# Patient Record
Sex: Female | Born: 1970
Health system: Southern US, Community
[De-identification: ages and names within clinical notes are randomized; demographics above are authoritative.]

## PROBLEM LIST (undated history)

## (undated) DIAGNOSIS — E079 Disorder of thyroid, unspecified: Secondary | ICD-10-CM

## (undated) DIAGNOSIS — M8580 Other specified disorders of bone density and structure, unspecified site: Secondary | ICD-10-CM

## (undated) HISTORY — DX: Other specified disorders of bone density and structure, unspecified site: M85.80

## (undated) HISTORY — PX: NO PAST SURGERIES: SHX2092

## (undated) HISTORY — DX: Disorder of thyroid, unspecified: E07.9

---

## 2010-10-03 ENCOUNTER — Encounter
Admission: RE | Admit: 2010-10-03 | Discharge: 2010-10-03 | Payer: Self-pay | Source: Home / Self Care | Attending: Obstetrics & Gynecology | Admitting: Obstetrics & Gynecology

## 2012-06-18 ENCOUNTER — Other Ambulatory Visit: Payer: Self-pay | Admitting: Obstetrics & Gynecology

## 2012-06-18 DIAGNOSIS — Z1231 Encounter for screening mammogram for malignant neoplasm of breast: Secondary | ICD-10-CM

## 2012-07-16 ENCOUNTER — Ambulatory Visit: Payer: Self-pay

## 2012-07-31 ENCOUNTER — Ambulatory Visit
Admission: RE | Admit: 2012-07-31 | Discharge: 2012-07-31 | Disposition: A | Discharge status: Home / Self Care | Payer: BC Managed Care – PPO | Source: Ambulatory Visit | Attending: Obstetrics & Gynecology | Admitting: Obstetrics & Gynecology

## 2012-07-31 DIAGNOSIS — Z1231 Encounter for screening mammogram for malignant neoplasm of breast: Secondary | ICD-10-CM

## 2012-08-09 ENCOUNTER — Ambulatory Visit: Payer: Self-pay

## 2017-03-20 DIAGNOSIS — E039 Hypothyroidism, unspecified: Secondary | ICD-10-CM | POA: Insufficient documentation

## 2017-03-26 ENCOUNTER — Other Ambulatory Visit: Payer: Self-pay | Admitting: Physician Assistant

## 2017-03-26 DIAGNOSIS — M8580 Other specified disorders of bone density and structure, unspecified site: Secondary | ICD-10-CM

## 2017-03-26 DIAGNOSIS — Z1231 Encounter for screening mammogram for malignant neoplasm of breast: Secondary | ICD-10-CM

## 2017-09-05 ENCOUNTER — Ambulatory Visit
Admission: RE | Admit: 2017-09-05 | Discharge: 2017-09-05 | Disposition: A | Discharge status: Home / Self Care | Payer: 59 | Source: Ambulatory Visit | Attending: Physician Assistant | Admitting: Physician Assistant

## 2017-09-05 DIAGNOSIS — Z1231 Encounter for screening mammogram for malignant neoplasm of breast: Secondary | ICD-10-CM

## 2017-09-05 DIAGNOSIS — M8580 Other specified disorders of bone density and structure, unspecified site: Secondary | ICD-10-CM

## 2017-09-05 DIAGNOSIS — M8589 Other specified disorders of bone density and structure, multiple sites: Secondary | ICD-10-CM | POA: Insufficient documentation

## 2018-11-07 ENCOUNTER — Other Ambulatory Visit: Payer: Self-pay | Admitting: Physician Assistant

## 2018-11-07 DIAGNOSIS — Z1231 Encounter for screening mammogram for malignant neoplasm of breast: Secondary | ICD-10-CM

## 2018-11-15 ENCOUNTER — Ambulatory Visit
Admission: RE | Admit: 2018-11-15 | Discharge: 2018-11-15 | Disposition: A | Discharge status: Home / Self Care | Payer: No Typology Code available for payment source | Source: Ambulatory Visit | Attending: Physician Assistant | Admitting: Physician Assistant

## 2018-11-15 ENCOUNTER — Other Ambulatory Visit: Payer: Self-pay

## 2018-11-15 ENCOUNTER — Other Ambulatory Visit: Payer: Self-pay | Admitting: Obstetrics & Gynecology

## 2018-11-15 DIAGNOSIS — Z1231 Encounter for screening mammogram for malignant neoplasm of breast: Secondary | ICD-10-CM

## 2018-11-22 ENCOUNTER — Other Ambulatory Visit: Payer: Self-pay

## 2018-11-26 ENCOUNTER — Encounter: Payer: Self-pay | Admitting: Obstetrics & Gynecology

## 2018-11-26 ENCOUNTER — Other Ambulatory Visit: Payer: Self-pay

## 2018-11-26 ENCOUNTER — Ambulatory Visit (INDEPENDENT_AMBULATORY_CARE_PROVIDER_SITE_OTHER): Payer: No Typology Code available for payment source | Admitting: Obstetrics & Gynecology

## 2018-11-26 VITALS — BP 102/68 | Ht 66.0 in | Wt 138.0 lb

## 2018-11-26 DIAGNOSIS — Z789 Other specified health status: Secondary | ICD-10-CM

## 2018-11-26 DIAGNOSIS — E039 Hypothyroidism, unspecified: Secondary | ICD-10-CM | POA: Diagnosis not present

## 2018-11-26 DIAGNOSIS — Z01419 Encounter for gynecological examination (general) (routine) without abnormal findings: Secondary | ICD-10-CM | POA: Diagnosis not present

## 2018-11-26 DIAGNOSIS — M8580 Other specified disorders of bone density and structure, unspecified site: Secondary | ICD-10-CM

## 2018-11-26 MED ORDER — LEVOTHYROXINE SODIUM 100 MCG PO TABS: 100.0000 ug | ORAL_TABLET | Freq: Every day | ORAL | 4 refills | Status: DC

## 2018-11-26 NOTE — Progress Notes (Signed)
Laura Murphy 02-May-1971 [id number]   History:    48 y.o. G2P1A1L1 Married  RP:  Established patient presenting for annual gyn exam   HPI:  Menses every 1-2 months, normal flow.  No BTB.  No pelvic pain.  No pain with IC.  Using condoms.  Breasts tender before menses.  BMI 22.27.  Needs to exercise more.  Will do fasting Health labs here today.   Past medical history,surgical history, family history and social history were all reviewed and documented in the EPIC chart.  Gynecologic History Patient's last menstrual period was 11/22/2018. Contraception: condoms Last Pap: 2017. Results were: normal Last mammogram: 11/2018.  Results were: Negative Bone Density: Osteopenia -1.5 09/2017.  Colonoscopy: Never  Obstetric History OB History  Gravida Para Term Preterm AB Living  2       2 1   SAB TAB Ectopic Multiple Live Births  1 1     1     # Outcome Date GA Lbr Len/2nd Weight Sex Delivery Anes PTL Lv  2 TAB           1 SAB              ROS: A ROS was performed and pertinent positives and negatives are included in the history.  GENERAL: No fevers or chills. HEENT: No change in vision, no earache, sore throat or sinus congestion. NECK: No pain or stiffness. CARDIOVASCULAR: No chest pain or pressure. No palpitations. PULMONARY: No shortness of breath, cough or wheeze. GASTROINTESTINAL: No abdominal pain, nausea, vomiting or diarrhea, melena or bright red blood per rectum. GENITOURINARY: No urinary frequency, urgency, hesitancy or dysuria. MUSCULOSKELETAL: No joint or muscle pain, no back pain, no recent trauma. DERMATOLOGIC: No rash, no itching, no lesions. ENDOCRINE: No polyuria, polydipsia, no heat or cold intolerance. No recent change in weight. HEMATOLOGICAL: No anemia or easy bruising or bleeding. NEUROLOGIC: No headache, seizures, numbness, tingling or weakness. PSYCHIATRIC: No depression, no loss of interest in normal activity or change in sleep pattern.     Exam:   BP 102/68    Ht 5' 6"  (1.676 m)   Wt 138 lb (62.6 kg)   LMP 11/22/2018   BMI 22.27 kg/m   Body mass index is 22.27 kg/m.  General appearance : Well developed well nourished female. No acute distress HEENT: Eyes: no retinal hemorrhage or exudates,  Neck supple, trachea midline, no carotid bruits, no thyroidmegaly Lungs: Clear to auscultation, no rhonchi or wheezes, or rib retractions  Heart: Regular rate and rhythm, no murmurs or gallops Breast:Examined in sitting and supine position were symmetrical in appearance, no palpable masses or tenderness,  no skin retraction, no nipple inversion, no nipple discharge, no skin discoloration, no axillary or supraclavicular lymphadenopathy Abdomen: no palpable masses or tenderness, no rebound or guarding Extremities: no edema or skin discoloration or tenderness  Pelvic: Vulva: Normal             Vagina: No gross lesions or discharge  Cervix: No gross lesions or discharge.  Pap reflex done.  Uterus  AV, normal size, shape and consistency, non-tender and mobile  Adnexa  Without masses or tenderness  Anus: Normal   Assessment/Plan:  48 y.o. female for annual exam   1. Encounter for routine gynecological examination with Papanicolaou smear of cervix Normal gynecologic exam.  Pap reflex done.  Breast exam normal.  Screening mammogram March 2020-.  Fasting health labs here today. - CBC - Comp Met (CMET) - TSH - Lipid panel - VITAMIN  D 25 Hydroxy (Vit-D Deficiency, Fractures)  2. Uses condoms for birth control  3. Acquired hypothyroidism TSH checked today.  Levothyroxine 100 mcg 1 tablet daily represcribed.  Will adjust if TSH is abnormal.  4. Osteopenia, multiple sites BD 09/2017 Osteopenia T-Score -1.5.  Recommend vitamin D supplements, calcium intake total of 1.2 to 1.5 g/day and regular weightbearing physical activities.  We will adjust vitamin D supplements according to vitamin D levels today.  We will repeat a bone density here in January 2021. - DG  Bone Density; Future  Other orders - levothyroxine (SYNTHROID, LEVOTHROID) 100 MCG tablet; Take 1 tablet (100 mcg total) by mouth daily before breakfast.  Counseling on above issues and coordination of care more than 50% for 10 minutes.  Princess Bruins MD, 9:27 AM 11/26/2018

## 2018-11-26 NOTE — Patient Instructions (Signed)
1. Encounter for routine gynecological examination with Papanicolaou smear of cervix Normal gynecologic exam.  Pap reflex done.  Breast exam normal.  Screening mammogram March 2020-.  Fasting health labs here today. - CBC - Comp Met (CMET) - TSH - Lipid panel - VITAMIN D 25 Hydroxy (Vit-D Deficiency, Fractures)  2. Uses condoms for birth control  3. Acquired hypothyroidism TSH checked today.  Levothyroxine 100 mcg 1 tablet daily represcribed.  Will adjust if TSH is abnormal.  4. Osteopenia, multiple sites BD 09/2017 Osteopenia T-Score -1.5.  Recommend vitamin D supplements, calcium intake total of 1.2 to 1.5 g/day and regular weightbearing physical activities.  We will adjust vitamin D supplements according to vitamin D levels today.  We will repeat a bone density here in January 2021. - DG Bone Density; Future  Other orders - levothyroxine (SYNTHROID, LEVOTHROID) 100 MCG tablet; Take 1 tablet (100 mcg total) by mouth daily before breakfast.  Laura Murphy, it was a pleasure seeing you today!  I will inform you of your results as soon as they are available.

## 2018-11-27 LAB — CBC
HCT: 37.1 % (ref 35.0–45.0)
Hemoglobin: 12.4 g/dL (ref 11.7–15.5)
MCH: 30 pg (ref 27.0–33.0)
MCHC: 33.4 g/dL (ref 32.0–36.0)
MCV: 89.8 fL (ref 80.0–100.0)
MPV: 11.1 fL (ref 7.5–12.5)
Platelets: 250 10*3/uL (ref 140–400)
RBC: 4.13 10*6/uL (ref 3.80–5.10)
RDW: 12.5 % (ref 11.0–15.0)
WBC: 5.1 10*3/uL (ref 3.8–10.8)

## 2018-11-27 LAB — LIPID PANEL
Cholesterol: 162 mg/dL (ref <200)
HDL: 46 mg/dL — ABNORMAL LOW (ref >=50)
LDL Cholesterol (Calc): 100 mg/dL (calc) — ABNORMAL HIGH
Non-HDL Cholesterol (Calc): 116 mg/dL (calc) (ref <130)
Total CHOL/HDL Ratio: 3.5 (calc) (ref <5.0)
Triglycerides: 69 mg/dL (ref <150)

## 2018-11-27 LAB — VITAMIN D 25 HYDROXY (VIT D DEFICIENCY, FRACTURES): Vit D, 25-Hydroxy: 69 ng/mL (ref 30–100)

## 2018-11-27 LAB — PAP IG W/ RFLX HPV ASCU

## 2018-11-27 LAB — COMPREHENSIVE METABOLIC PANEL
AG Ratio: 1.4 (calc) (ref 1.0–2.5)
ALT: 9 U/L (ref 6–29)
AST: 12 U/L (ref 10–35)
Albumin: 4.3 g/dL (ref 3.6–5.1)
Alkaline phosphatase (APISO): 69 U/L (ref 31–125)
BUN: 14 mg/dL (ref 7–25)
CO2: 28 mmol/L (ref 20–32)
Calcium: 9.2 mg/dL (ref 8.6–10.2)
Chloride: 106 mmol/L (ref 98–110)
Creat: 0.76 mg/dL (ref 0.50–1.10)
Globulin: 3.1 g/dL (calc) (ref 1.9–3.7)
Glucose, Bld: 85 mg/dL (ref 65–99)
Potassium: 4.6 mmol/L (ref 3.5–5.3)
Sodium: 140 mmol/L (ref 135–146)
Total Bilirubin: 0.5 mg/dL (ref 0.2–1.2)
Total Protein: 7.4 g/dL (ref 6.1–8.1)

## 2018-11-27 LAB — TSH: TSH: 2.98 mIU/L

## 2018-11-28 ENCOUNTER — Telehealth: Payer: Self-pay | Admitting: *Deleted

## 2018-11-28 NOTE — Telephone Encounter (Signed)
Patient informed with the below note, she does not eat much fish due to the mercury. She asked if a fish oil supplement would help improve HDL? If so do you recommend Omega fish 3,6.9. or 12 ?

## 2018-11-28 NOTE — Telephone Encounter (Signed)
-----   Message from Genia Del, MD sent at 11/27/2018 11:10 AM EDT ----- Pap test negative.  Labs all normal.  Minor diet adjustments, more fish to improve HDL.  LDL borderline at 100.  Ratio of Total Chol/HDL ok at 3.5.

## 2018-11-29 NOTE — Telephone Encounter (Signed)
Yes, Omega fish 9 would be good.

## 2018-11-29 NOTE — Telephone Encounter (Signed)
Patient informed. 

## 2018-12-12 ENCOUNTER — Ambulatory Visit: Payer: Self-pay | Admitting: Obstetrics & Gynecology

## 2018-12-17 ENCOUNTER — Telehealth: Payer: Self-pay | Admitting: *Deleted

## 2018-12-17 MED ORDER — SYNTHROID 100 MCG PO TABS: 100.0000 ug | ORAL_TABLET | Freq: Every day | ORAL | 4 refills | Status: DC

## 2018-12-17 NOTE — Telephone Encounter (Signed)
Patient called requesting if 90 day supply of brand synthroid 100 mcg could be sent to [facility] in [place]. Rx sent.

## 2019-01-13 ENCOUNTER — Ambulatory Visit: Payer: Self-pay | Admitting: Obstetrics & Gynecology

## 2019-09-03 ENCOUNTER — Telehealth: Payer: Self-pay

## 2019-09-03 MED ORDER — LEVOTHYROXINE SODIUM 100 MCG PO TABS: 100.0000 ug | ORAL_TABLET | Freq: Every day | ORAL | 0 refills | Status: DC

## 2019-09-03 NOTE — Telephone Encounter (Addendum)
Patient is 6 hours away on vacation and realized she left her Synthroid at home. Is requesting #4 be sent into pharmacy at vacation site.  Rx sent.  Patient informed.  I called pharmacy and left message that Rx should be DAW/brand only as I forgot to indicate that on electronic RX.

## 2019-12-01 ENCOUNTER — Other Ambulatory Visit: Payer: Self-pay

## 2019-12-02 ENCOUNTER — Encounter: Payer: Self-pay | Admitting: Obstetrics & Gynecology

## 2019-12-02 ENCOUNTER — Ambulatory Visit (INDEPENDENT_AMBULATORY_CARE_PROVIDER_SITE_OTHER): Payer: BC Managed Care – PPO | Admitting: Obstetrics & Gynecology

## 2019-12-02 VITALS — BP 102/68 | Ht 66.25 in | Wt 138.0 lb

## 2019-12-02 DIAGNOSIS — Z789 Other specified health status: Secondary | ICD-10-CM | POA: Diagnosis not present

## 2019-12-02 DIAGNOSIS — E786 Lipoprotein deficiency: Secondary | ICD-10-CM | POA: Diagnosis not present

## 2019-12-02 DIAGNOSIS — M8580 Other specified disorders of bone density and structure, unspecified site: Secondary | ICD-10-CM | POA: Diagnosis not present

## 2019-12-02 DIAGNOSIS — Z1321 Encounter for screening for nutritional disorder: Secondary | ICD-10-CM | POA: Diagnosis not present

## 2019-12-02 DIAGNOSIS — N951 Menopausal and female climacteric states: Secondary | ICD-10-CM | POA: Diagnosis not present

## 2019-12-02 DIAGNOSIS — Z01419 Encounter for gynecological examination (general) (routine) without abnormal findings: Secondary | ICD-10-CM | POA: Diagnosis not present

## 2019-12-02 DIAGNOSIS — E039 Hypothyroidism, unspecified: Secondary | ICD-10-CM

## 2019-12-02 LAB — COMPREHENSIVE METABOLIC PANEL
AG Ratio: 1.4 (calc) (ref 1.0–2.5)
ALT: 10 U/L (ref 6–29)
AST: 13 U/L (ref 10–35)
Albumin: 4 g/dL (ref 3.6–5.1)
Alkaline phosphatase (APISO): 63 U/L (ref 31–125)
BUN: 10 mg/dL (ref 7–25)
CO2: 28 mmol/L (ref 20–32)
Calcium: 9 mg/dL (ref 8.6–10.2)
Chloride: 106 mmol/L (ref 98–110)
Creat: 0.62 mg/dL (ref 0.50–1.10)
Globulin: 2.9 g/dL (calc) (ref 1.9–3.7)
Glucose, Bld: 84 mg/dL (ref 65–99)
Potassium: 4 mmol/L (ref 3.5–5.3)
Sodium: 138 mmol/L (ref 135–146)
Total Bilirubin: 0.4 mg/dL (ref 0.2–1.2)
Total Protein: 6.9 g/dL (ref 6.1–8.1)

## 2019-12-02 LAB — CBC
HCT: 37.8 % (ref 35.0–45.0)
Hemoglobin: 12.6 g/dL (ref 11.7–15.5)
MCH: 30.1 pg (ref 27.0–33.0)
MCHC: 33.3 g/dL (ref 32.0–36.0)
MCV: 90.2 fL (ref 80.0–100.0)
MPV: 10.3 fL (ref 7.5–12.5)
Platelets: 271 10*3/uL (ref 140–400)
RBC: 4.19 10*6/uL (ref 3.80–5.10)
RDW: 12.1 % (ref 11.0–15.0)
WBC: 4.3 10*3/uL (ref 3.8–10.8)

## 2019-12-02 LAB — TSH: TSH: 1.33 mIU/L

## 2019-12-02 LAB — LIPID PANEL
Cholesterol: 169 mg/dL (ref <200)
HDL: 50 mg/dL (ref >=50)
LDL Cholesterol (Calc): 105 mg/dL (calc) — ABNORMAL HIGH
Non-HDL Cholesterol (Calc): 119 mg/dL (calc) (ref <130)
Total CHOL/HDL Ratio: 3.4 (calc) (ref <5.0)
Triglycerides: 49 mg/dL (ref <150)

## 2019-12-02 LAB — VITAMIN D 25 HYDROXY (VIT D DEFICIENCY, FRACTURES): Vit D, 25-Hydroxy: 43 ng/mL (ref 30–100)

## 2019-12-02 MED ORDER — LEVOTHYROXINE SODIUM 100 MCG PO TABS: 100.0000 ug | ORAL_TABLET | Freq: Every day | ORAL | 4 refills | Status: DC

## 2019-12-02 NOTE — Progress Notes (Signed)
[facility] December 03, 1970 [id number]   History:    49 y.o.  G2P1A1L1 Married  RP:  Established patient presenting for annual gyn exam   HPI:  Menses every 1-2 months, normal flow.  No BTB.  No pelvic pain.  No pain with IC.  Using condoms.  Breasts tender before menses.  BMI 22.11.  Needs to exercise more.  Will do fasting Health labs here today.   Past medical history,surgical history, family history and social history were all reviewed and documented in the EPIC chart.  Gynecologic History No LMP recorded. Patient is premenopausal.  Obstetric History OB History  Gravida Para Term Preterm AB Living  2       2 1   SAB TAB Ectopic Multiple Live Births  1 1     1     # Outcome Date GA Lbr Len/2nd Weight Sex Delivery Anes PTL Lv  2 TAB           1 SAB              ROS: A ROS was performed and pertinent positives and negatives are included in the history.  GENERAL: No fevers or chills. HEENT: No change in vision, no earache, sore throat or sinus congestion. NECK: No pain or stiffness. CARDIOVASCULAR: No chest pain or pressure. No palpitations. PULMONARY: No shortness of breath, cough or wheeze. GASTROINTESTINAL: No abdominal pain, nausea, vomiting or diarrhea, melena or bright red blood per rectum. GENITOURINARY: No urinary frequency, urgency, hesitancy or dysuria. MUSCULOSKELETAL: No joint or muscle pain, no back pain, no recent trauma. DERMATOLOGIC: No rash, no itching, no lesions. ENDOCRINE: No polyuria, polydipsia, no heat or cold intolerance. No recent change in weight. HEMATOLOGICAL: No anemia or easy bruising or bleeding. NEUROLOGIC: No headache, seizures, numbness, tingling or weakness. PSYCHIATRIC: No depression, no loss of interest in normal activity or change in sleep pattern.     Exam:   BP 102/68    Ht 5' 6.25" (1.683 m)    Wt 138 lb (62.6 kg)    BMI 22.11 kg/m   Body mass index is 22.11 kg/m.  General appearance : Well developed well nourished female. No acute  distress HEENT: Eyes: no retinal hemorrhage or exudates,  Neck supple, trachea midline, no carotid bruits, no thyroidmegaly Lungs: Clear to auscultation, no rhonchi or wheezes, or rib retractions  Heart: Regular rate and rhythm, no murmurs or gallops Breast:Examined in sitting and supine position were symmetrical in appearance, no palpable masses or tenderness,  no skin retraction, no nipple inversion, no nipple discharge, no skin discoloration, no axillary or supraclavicular lymphadenopathy Abdomen: no palpable masses or tenderness, no rebound or guarding Extremities: no edema or skin discoloration or tenderness  Pelvic: Vulva: Normal             Vagina: No gross lesions or discharge  Cervix: No gross lesions or discharge.  Pap reflex done.  Uterus  AV, normal size, shape and consistency, non-tender and mobile  Adnexa  Without masses or tenderness  Anus: Normal   Assessment/Plan:  48 y.o. female for annual exam   1. Encounter for routine gynecological examination with Papanicolaou smear of cervix Normal gynecologic exam.  Pap reflex done today.  Breast exam normal.  Will schedule a screening mammogram now.  Colonoscopy to organize after April this year at age 70.  Fasting health labs here today.  Good body mass index at 22.11.  Continue on healthy nutrition and fitness. - CBC - Comp Met (CMET) - TSH -  Lipid panel - VITAMIN D 25 Hydroxy (Vit-D Deficiency, Fractures)  2. Perimenopause Observation in perimenopause.  Recommend vitamin D supplements, calcium intake of 1200 mg daily and regular weightbearing physical activities.  3. Uses condoms for birth control  4. Osteopenia, unspecified location  5. Acquired hypothyroidism TSH done today.  Levothyroxine represcribed at the same dose.  Other orders - levothyroxine (SYNTHROID) 100 MCG tablet; Take 1 tablet (100 mcg total) by mouth daily before breakfast.  Princess Bruins MD, 8:16 AM 12/02/2019

## 2019-12-03 ENCOUNTER — Encounter: Payer: Self-pay | Admitting: Obstetrics & Gynecology

## 2019-12-03 LAB — PAP IG W/ RFLX HPV ASCU

## 2019-12-03 NOTE — Patient Instructions (Signed)
1. Encounter for routine gynecological examination with Papanicolaou smear of cervix Normal gynecologic exam.  Pap reflex done today.  Breast exam normal.  Will schedule a screening mammogram now.  Colonoscopy to organize after April this year at age 49.  Fasting health labs here today.  Good body mass index at 22.11.  Continue on healthy nutrition and fitness. - CBC - Comp Met (CMET) - TSH - Lipid panel - VITAMIN D 25 Hydroxy (Vit-D Deficiency, Fractures)  2. Perimenopause Observation in perimenopause.  Recommend vitamin D supplements, calcium intake of 1200 mg daily and regular weightbearing physical activities.  3. Uses condoms for birth control  4. Osteopenia, unspecified location  5. Acquired hypothyroidism TSH done today.  Levothyroxine represcribed at the same dose.  Other orders - levothyroxine (SYNTHROID) 100 MCG tablet; Take 1 tablet (100 mcg total) by mouth daily before breakfast.  Nevin Bloodgood, it was a pleasure seeing you today! I will inform you of your results as soon as they are available.

## 2019-12-08 ENCOUNTER — Telehealth: Payer: Self-pay | Admitting: *Deleted

## 2019-12-08 ENCOUNTER — Encounter: Payer: Self-pay | Admitting: Gastroenterology

## 2019-12-08 NOTE — Telephone Encounter (Signed)
I called and left detailed message on cell to call [facility] to schedule, no referral needed. Number left on voicemail to call [phone]

## 2019-12-08 NOTE — Telephone Encounter (Signed)
-----   Message from Genia Del, MD sent at 12/02/2019  9:06 AM EDT ----- Regarding: Refer to Gastro Screening Colonoscopy.

## 2019-12-30 ENCOUNTER — Ambulatory Visit (AMBULATORY_SURGERY_CENTER): Payer: Self-pay | Admitting: *Deleted

## 2019-12-30 ENCOUNTER — Other Ambulatory Visit: Payer: Self-pay

## 2019-12-30 VITALS — Temp 96.8°F | Ht 66.25 in | Wt 138.0 lb

## 2019-12-30 DIAGNOSIS — Z1211 Encounter for screening for malignant neoplasm of colon: Secondary | ICD-10-CM

## 2019-12-30 DIAGNOSIS — Z01818 Encounter for other preprocedural examination: Secondary | ICD-10-CM

## 2019-12-30 NOTE — Progress Notes (Signed)
No egg or soy allergy known to patient  No past sedation with any surgeries  or procedures, no past  intubation   No diet pills per patient No home 02 use per patient  No blood thinners per patient  Pt denies issues with constipation  No A fib or A flutter  EMMI video sent to pt's e mail   Due to the COVID-19 pandemic we are asking patients to follow these guidelines. Please only bring one care partner. Please be aware that your care partner may wait in the car in the parking lot or if they feel like they will be too hot to wait in the car, they may wait in the lobby on the 4th floor. All care partners are required to wear a mask the entire time (we do not have any that we can provide them), they need to practice social distancing, and we will do a Covid check for all patient's and care partners when you arrive. Also we will check their temperature and your temperature. If the care partner waits in their car they need to stay in the parking lot the entire time and we will call them on their cell phone when the patient is ready for discharge so they can bring the car to the front of the building. Also all patient's will need to wear a mask into building.  

## 2020-01-01 ENCOUNTER — Other Ambulatory Visit: Payer: Self-pay | Admitting: Obstetrics & Gynecology

## 2020-01-01 NOTE — Telephone Encounter (Signed)
Normal TSH 1.33 on 11/23/19.

## 2020-01-09 ENCOUNTER — Encounter: Payer: Self-pay | Admitting: Gastroenterology

## 2020-01-12 ENCOUNTER — Telehealth: Payer: Self-pay

## 2020-01-12 NOTE — Telephone Encounter (Signed)
Pts colon rescheduled to 02/23/20@11 :30am, covid screen scheduled for 02/19/20@8 :10am. Pt aware of appts.

## 2020-01-12 NOTE — Telephone Encounter (Signed)
Pt will need to be rescheduled to at least 14 days after her second covid vaccine or have a covid test prior to procedure. Please have pt stop prep and reschedule her appt. Dr. Orvan Falconer is off and not her to ok procedure. Dr. Rhea Belton medical director had pt reschedule due to same situation as this is our policy.

## 2020-01-12 NOTE — Telephone Encounter (Signed)
Called pt regarding no show for COVID test that is required for upcoming colonoscopy scheduled for tomorrow.  Pt stated that she was not aware that she had an appointment for a COVID test on 5/7 and said that she didn't notice the appt time in her letter.  Pt said that she had the first dose of the COVID vaccine but has not had the 2nd.  Pt said that she has already started the prep today @ 3:00 pm.  Advised pt to continue with prep as instructed and to arrive as scheduled.  Informed pt that I will need to send a message to Dr. Orvan Falconer to clear her for the procedure since she missed her COVID test and has not had the 2nd dose of the COVID vaccine.

## 2020-01-13 ENCOUNTER — Encounter: Payer: No Typology Code available for payment source | Admitting: Gastroenterology

## 2020-02-23 ENCOUNTER — Encounter: Payer: BC Managed Care – PPO | Admitting: Gastroenterology

## 2020-07-13 ENCOUNTER — Telehealth: Payer: Self-pay

## 2020-07-13 NOTE — Telephone Encounter (Signed)
Ms. Laura Murphy has never been seen here and wanted to know what should she do that she is is have some tightness on her chest and some pressure on her Left arm. Per [place] Meo due that who have no history about her, Laura Murphy should call her PCP to see what she should do next. Pt understood.

## 2020-11-04 DIAGNOSIS — R140 Abdominal distension (gaseous): Secondary | ICD-10-CM | POA: Diagnosis not present

## 2020-11-04 DIAGNOSIS — Z1211 Encounter for screening for malignant neoplasm of colon: Secondary | ICD-10-CM | POA: Diagnosis not present

## 2020-11-04 DIAGNOSIS — K5900 Constipation, unspecified: Secondary | ICD-10-CM | POA: Diagnosis not present

## 2020-11-09 DIAGNOSIS — Z1231 Encounter for screening mammogram for malignant neoplasm of breast: Secondary | ICD-10-CM | POA: Diagnosis not present

## 2020-11-09 DIAGNOSIS — M8589 Other specified disorders of bone density and structure, multiple sites: Secondary | ICD-10-CM | POA: Diagnosis not present

## 2020-12-02 ENCOUNTER — Encounter: Payer: BC Managed Care – PPO | Admitting: Obstetrics & Gynecology

## 2021-01-03 DIAGNOSIS — D122 Benign neoplasm of ascending colon: Secondary | ICD-10-CM | POA: Diagnosis not present

## 2021-01-03 DIAGNOSIS — K635 Polyp of colon: Secondary | ICD-10-CM | POA: Diagnosis not present

## 2021-01-03 DIAGNOSIS — Z1211 Encounter for screening for malignant neoplasm of colon: Secondary | ICD-10-CM | POA: Diagnosis not present

## 2021-01-11 ENCOUNTER — Other Ambulatory Visit: Payer: Self-pay | Admitting: Obstetrics & Gynecology

## 2021-06-14 ENCOUNTER — Telehealth: Payer: Self-pay

## 2021-06-14 MED ORDER — SYNTHROID 100 MCG PO TABS: 100.0000 ug | ORAL_TABLET | Freq: Every day | ORAL | 0 refills | Status: DC

## 2021-06-14 NOTE — Telephone Encounter (Signed)
Refill sent for 30 days supply. Patient informed.

## 2021-06-14 NOTE — Telephone Encounter (Signed)
Laura Del, MD  You 17 minutes ago (2:02 PM)   Agree with refill of Synthroid until Annual gyn exam

## 2021-06-14 NOTE — Telephone Encounter (Signed)
Patient called stating she is out of her Synthroid and needs refill until her AEX scheduled 06/22/21.  Last AEX was 12/02/19.

## 2021-06-22 ENCOUNTER — Other Ambulatory Visit: Payer: Self-pay

## 2021-06-22 ENCOUNTER — Encounter: Payer: Self-pay | Admitting: Obstetrics & Gynecology

## 2021-06-22 ENCOUNTER — Ambulatory Visit (INDEPENDENT_AMBULATORY_CARE_PROVIDER_SITE_OTHER): Payer: BC Managed Care – PPO | Admitting: Obstetrics & Gynecology

## 2021-06-22 VITALS — BP 110/72 | HR 58 | Resp 16 | Ht 66.25 in | Wt 139.0 lb

## 2021-06-22 DIAGNOSIS — Z01419 Encounter for gynecological examination (general) (routine) without abnormal findings: Secondary | ICD-10-CM

## 2021-06-22 DIAGNOSIS — Z789 Other specified health status: Secondary | ICD-10-CM

## 2021-06-22 DIAGNOSIS — E039 Hypothyroidism, unspecified: Secondary | ICD-10-CM | POA: Diagnosis not present

## 2021-06-22 DIAGNOSIS — M8580 Other specified disorders of bone density and structure, unspecified site: Secondary | ICD-10-CM | POA: Diagnosis not present

## 2021-06-22 DIAGNOSIS — N951 Menopausal and female climacteric states: Secondary | ICD-10-CM

## 2021-06-22 MED ORDER — SYNTHROID 100 MCG PO TABS: 100.0000 ug | ORAL_TABLET | Freq: Every day | ORAL | 4 refills | Status: DC

## 2021-06-22 NOTE — Progress Notes (Signed)
Laura Murphy Jul 26, 1971 [id number]   History:    50 y.o. G2P1A1L1 Married.  Son is 63 yo Sophomore in [place].   RP:  Established patient presenting for annual gyn exam    HPI:  Menses every 1-2 months, normal flow.  No BTB.  No pelvic pain.  No pain with IC.  Using condoms.  Breasts normal. Will schedule screening mammo. BMI 22.27.  Needs to exercise more.  Will do fasting Health labs here today.  Past medical history,surgical history, family history and social history were all reviewed and documented in the EPIC chart.  Gynecologic History Patient's last menstrual period was 06/01/2021 (exact date).  Obstetric History OB History  Gravida Para Term Preterm AB Living  2       2 1   SAB IAB Ectopic Multiple Live Births  1 1     1     # Outcome Date GA Lbr Len/2nd Weight Sex Delivery Anes PTL Lv  2 IAB           1 SAB              ROS: A ROS was performed and pertinent positives and negatives are included in the history.  GENERAL: No fevers or chills. HEENT: No change in vision, no earache, sore throat or sinus congestion. NECK: No pain or stiffness. CARDIOVASCULAR: No chest pain or pressure. No palpitations. PULMONARY: No shortness of breath, cough or wheeze. GASTROINTESTINAL: No abdominal pain, nausea, vomiting or diarrhea, melena or bright red blood per rectum. GENITOURINARY: No urinary frequency, urgency, hesitancy or dysuria. MUSCULOSKELETAL: No joint or muscle pain, no back pain, no recent trauma. DERMATOLOGIC: No rash, no itching, no lesions. ENDOCRINE: No polyuria, polydipsia, no heat or cold intolerance. No recent change in weight. HEMATOLOGICAL: No anemia or easy bruising or bleeding. NEUROLOGIC: No headache, seizures, numbness, tingling or weakness. PSYCHIATRIC: No depression, no loss of interest in normal activity or change in sleep pattern.     Exam:   BP 110/72   Pulse (!) 58   Resp 16   Ht 5' 6.25" (1.683 m)   Wt 139 lb (63 kg)   LMP 06/01/2021 (Exact Date)   BMI  22.27 kg/m   Body mass index is 22.27 kg/m.  General appearance : Well developed well nourished female. No acute distress HEENT: Eyes: no retinal hemorrhage or exudates,  Neck supple, trachea midline, no carotid bruits, no thyroidmegaly Lungs: Clear to auscultation, no rhonchi or wheezes, or rib retractions  Heart: Regular rate and rhythm, no murmurs or gallops Breast:Examined in sitting and supine position were symmetrical in appearance, no palpable masses or tenderness,  no skin retraction, no nipple inversion, no nipple discharge, no skin discoloration, no axillary or supraclavicular lymphadenopathy Abdomen: no palpable masses or tenderness, no rebound or guarding Extremities: no edema or skin discoloration or tenderness  Pelvic: Vulva: Normal             Vagina: No gross lesions or discharge  Cervix: No gross lesions or discharge  Uterus  AV, normal size, shape and consistency, non-tender and mobile  Adnexa  Without masses or tenderness  Anus: Normal   Assessment/Plan:  50 y.o. female for annual exam   1. Well female exam with routine gynecological exam Normal gynecologic exam.  Last Pap test in March 2021 was negative, no indication to repeat a Pap test at this time.  Breast exam normal.  We will schedule a screening mammogram now.  Colonoscopy 2021.  Good body mass index  of 22.27.  Continue with fitness and healthy nutrition.  Fasting health labs here today. - CBC - Comp Met (CMET) - Lipid Profile - Vitamin D 1,25 dihydroxy  2. Uses condoms for birth control  3. Perimenopause Oligomenorrhea with menses every 1 to 2 months.  We will check vitamin D level. - Vitamin D 1,25 dihydroxy  4. Osteopenia, unspecified location Osteopenia on bone density in 2020.  We will repeat bone density here now.  Vitamin D supplements, calcium intake of 1.5 g/day and regular weightbearing physical activity is recommended. - DG Bone Density; Future  5. Hypothyroidism  (acquired) Hypothyroidism on Synthroid.  Verify TSH level today.  Synthroid represcribed. - TSH  Other orders - VITAMIN D PO; Take 2,000 Int'l Units by mouth. - SYNTHROID 100 MCG tablet; Take 1 tablet (100 mcg total) by mouth daily before breakfast.   Princess Bruins MD, 8:33 AM 06/22/2021

## 2021-06-23 ENCOUNTER — Encounter: Payer: Self-pay | Admitting: Obstetrics & Gynecology

## 2021-06-25 LAB — CBC
HCT: 40.3 % (ref 35.0–45.0)
Hemoglobin: 13.2 g/dL (ref 11.7–15.5)
MCH: 29 pg (ref 27.0–33.0)
MCHC: 32.8 g/dL (ref 32.0–36.0)
MCV: 88.6 fL (ref 80.0–100.0)
MPV: 10.2 fL (ref 7.5–12.5)
Platelets: 278 10*3/uL (ref 140–400)
RBC: 4.55 10*6/uL (ref 3.80–5.10)
RDW: 12.4 % (ref 11.0–15.0)
WBC: 3.9 10*3/uL (ref 3.8–10.8)

## 2021-06-25 LAB — TSH: TSH: 0.88 mIU/L

## 2021-06-25 LAB — VITAMIN D 1,25 DIHYDROXY
Vitamin D 1, 25 (OH)2 Total: 51 pg/mL (ref 18–72)
Vitamin D2 1, 25 (OH)2: <8 pg/mL
Vitamin D3 1, 25 (OH)2: 51 pg/mL

## 2021-06-25 LAB — COMPREHENSIVE METABOLIC PANEL
AG Ratio: 1.4 (calc) (ref 1.0–2.5)
ALT: 13 U/L (ref 6–29)
AST: 16 U/L (ref 10–35)
Albumin: 4.2 g/dL (ref 3.6–5.1)
Alkaline phosphatase (APISO): 90 U/L (ref 37–153)
BUN: 14 mg/dL (ref 7–25)
CO2: 27 mmol/L (ref 20–32)
Calcium: 8.9 mg/dL (ref 8.6–10.4)
Chloride: 106 mmol/L (ref 98–110)
Creat: 0.65 mg/dL (ref 0.50–1.03)
Globulin: 3 g/dL (calc) (ref 1.9–3.7)
Glucose, Bld: 79 mg/dL (ref 65–99)
Potassium: 3.8 mmol/L (ref 3.5–5.3)
Sodium: 139 mmol/L (ref 135–146)
Total Bilirubin: 0.3 mg/dL (ref 0.2–1.2)
Total Protein: 7.2 g/dL (ref 6.1–8.1)

## 2021-06-25 LAB — LIPID PANEL
Cholesterol: 210 mg/dL — ABNORMAL HIGH (ref <200)
HDL: 55 mg/dL (ref >=50)
LDL Cholesterol (Calc): 139 mg/dL (calc) — ABNORMAL HIGH
Non-HDL Cholesterol (Calc): 155 mg/dL (calc) — ABNORMAL HIGH (ref <130)
Total CHOL/HDL Ratio: 3.8 (calc) (ref <5.0)
Triglycerides: 64 mg/dL (ref <150)

## 2021-10-19 ENCOUNTER — Other Ambulatory Visit: Payer: Self-pay | Admitting: Obstetrics & Gynecology

## 2021-10-19 DIAGNOSIS — Z1231 Encounter for screening mammogram for malignant neoplasm of breast: Secondary | ICD-10-CM

## 2021-11-01 ENCOUNTER — Ambulatory Visit
Admission: RE | Admit: 2021-11-01 | Discharge: 2021-11-01 | Disposition: A | Discharge status: Home / Self Care | Payer: BC Managed Care – PPO | Source: Ambulatory Visit | Attending: Obstetrics & Gynecology | Admitting: Obstetrics & Gynecology

## 2021-11-01 DIAGNOSIS — Z1231 Encounter for screening mammogram for malignant neoplasm of breast: Secondary | ICD-10-CM

## 2022-01-17 ENCOUNTER — Telehealth: Payer: Self-pay | Admitting: *Deleted

## 2022-01-17 MED ORDER — SYNTHROID 100 MCG PO TABS: 100.0000 ug | ORAL_TABLET | Freq: Every day | ORAL | 0 refills | Status: DC

## 2022-01-17 NOTE — Telephone Encounter (Signed)
Patient called left message in triage voicemail requesting refill on synthroid 100 mg tablet. Reports she is out. Annual exam was 06/2021, 1 year refills sent to [facility] in [place]. Patient said in voicemail rx should go to [facility]. Rx sent. I was going to call patient to relay however her voicemail is full. Rx sent to local pharmacy per her request. ?

## 2022-03-10 DIAGNOSIS — M546 Pain in thoracic spine: Secondary | ICD-10-CM | POA: Diagnosis not present

## 2022-03-10 DIAGNOSIS — S161XXA Strain of muscle, fascia and tendon at neck level, initial encounter: Secondary | ICD-10-CM | POA: Diagnosis not present

## 2022-03-16 ENCOUNTER — Ambulatory Visit
Admission: RE | Admit: 2022-03-16 | Discharge: 2022-03-16 | Disposition: A | Discharge status: Home / Self Care | Payer: BC Managed Care – PPO | Source: Ambulatory Visit | Attending: Obstetrics & Gynecology | Admitting: Obstetrics & Gynecology

## 2022-03-16 DIAGNOSIS — M8589 Other specified disorders of bone density and structure, multiple sites: Secondary | ICD-10-CM | POA: Diagnosis not present

## 2022-03-16 DIAGNOSIS — M8580 Other specified disorders of bone density and structure, unspecified site: Secondary | ICD-10-CM

## 2022-03-16 DIAGNOSIS — Z780 Asymptomatic menopausal state: Secondary | ICD-10-CM | POA: Diagnosis not present

## 2022-04-23 IMAGING — MG MM DIGITAL SCREENING BILAT W/ TOMO AND CAD
8 series · 8 of 24 positions shown · non-contrast
Comparison: Previous exam(s).

CLINICAL DATA: Screening.

EXAM:
DIGITAL SCREENING BILATERAL MAMMOGRAM WITH TOMOSYNTHESIS AND CAD
TECHNIQUE: Bilateral screening digital craniocaudal and mediolateral oblique
mammograms were obtained. Bilateral screening digital breast
tomosynthesis was performed. The images were evaluated with
computer-aided detection.

[L MLO synth-2D]
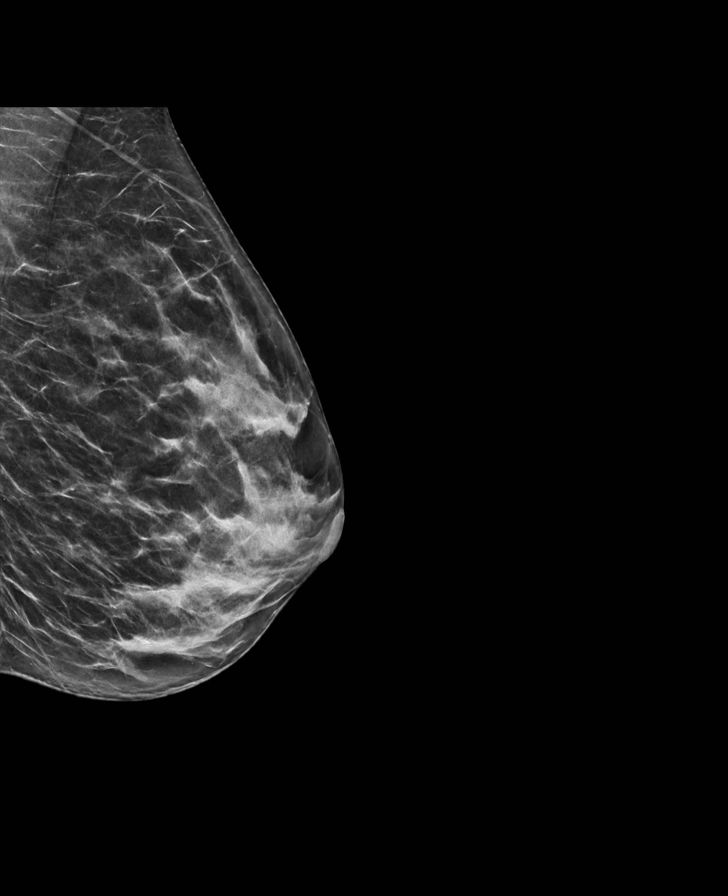

[L CC synth-2D]
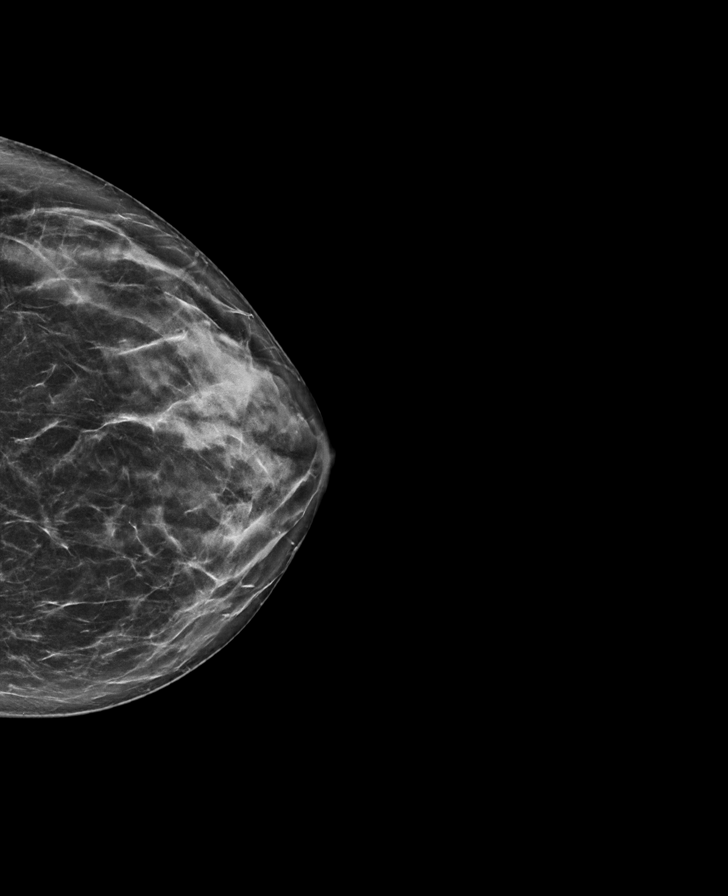

[R MLO synth-2D]
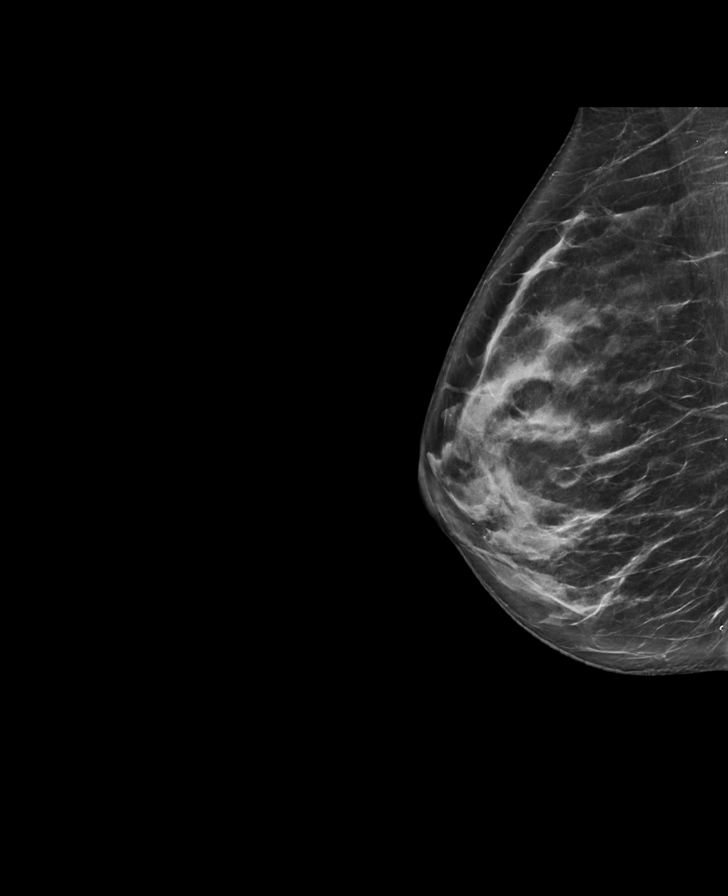

[R CC synth-2D]
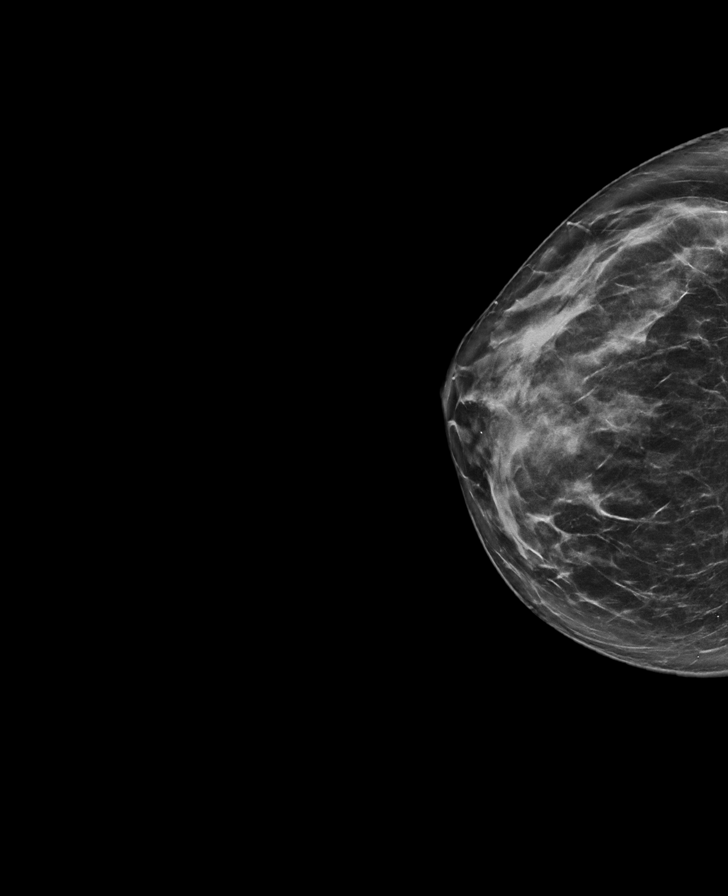

[R CC tomo · tomo slice 35/69.0]
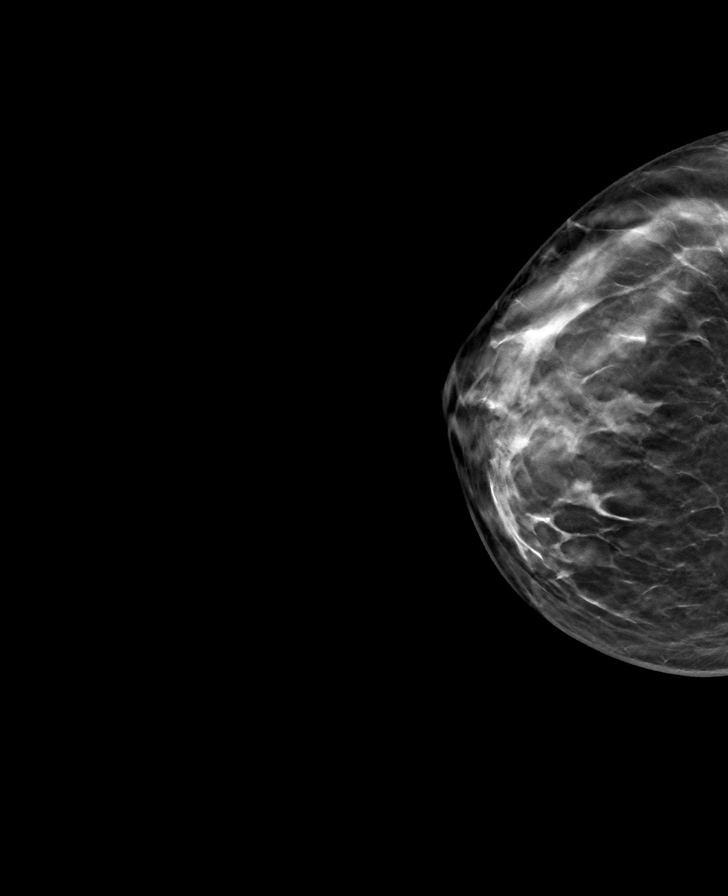

[L CC tomo · tomo slice 34/67.0]
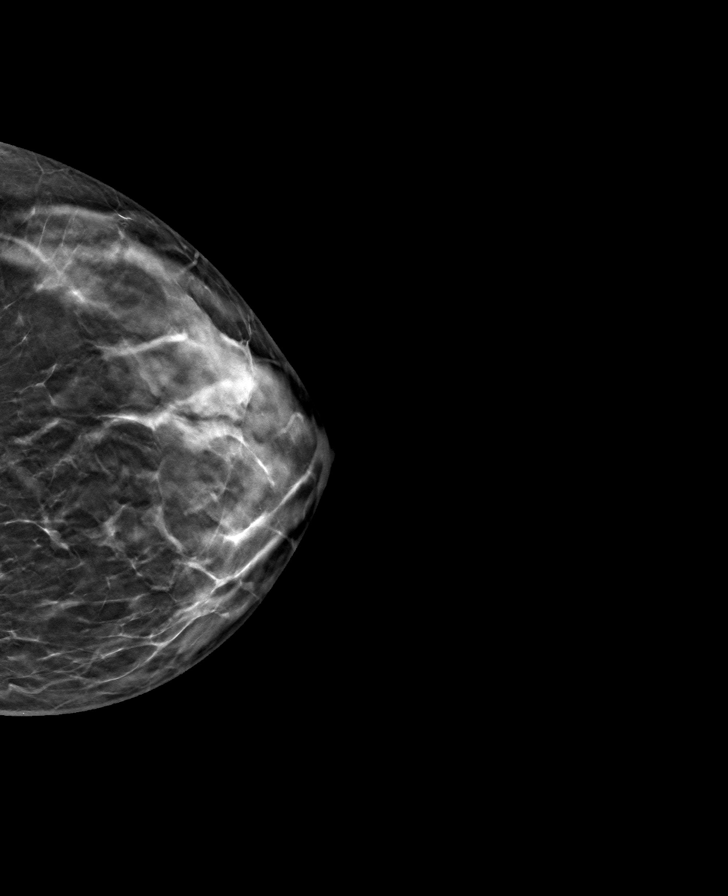

[L MLO tomo · tomo slice 32/63.0]
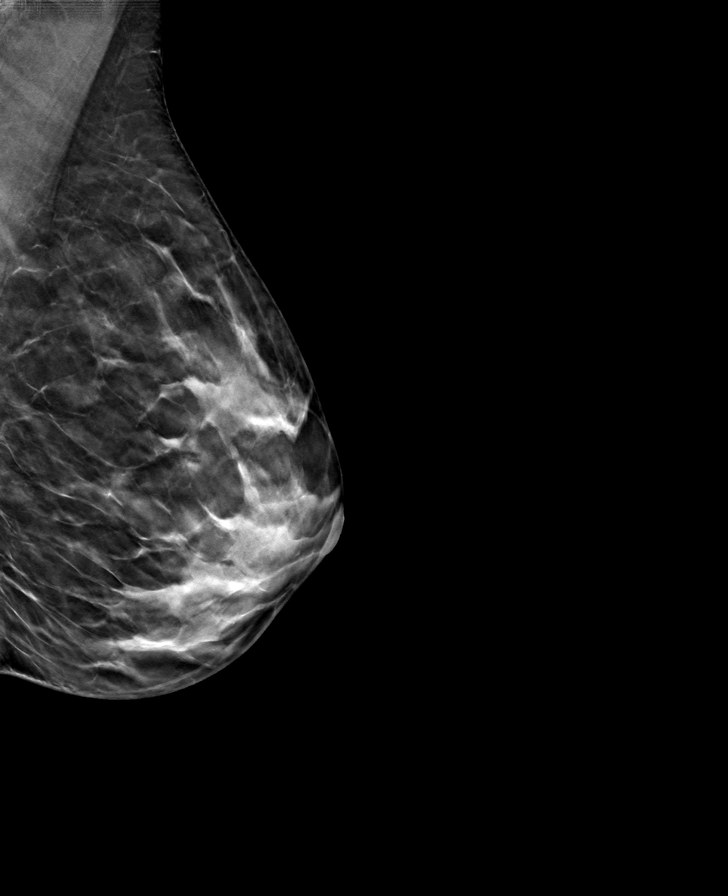

[R MLO tomo · tomo slice 39/76.0]
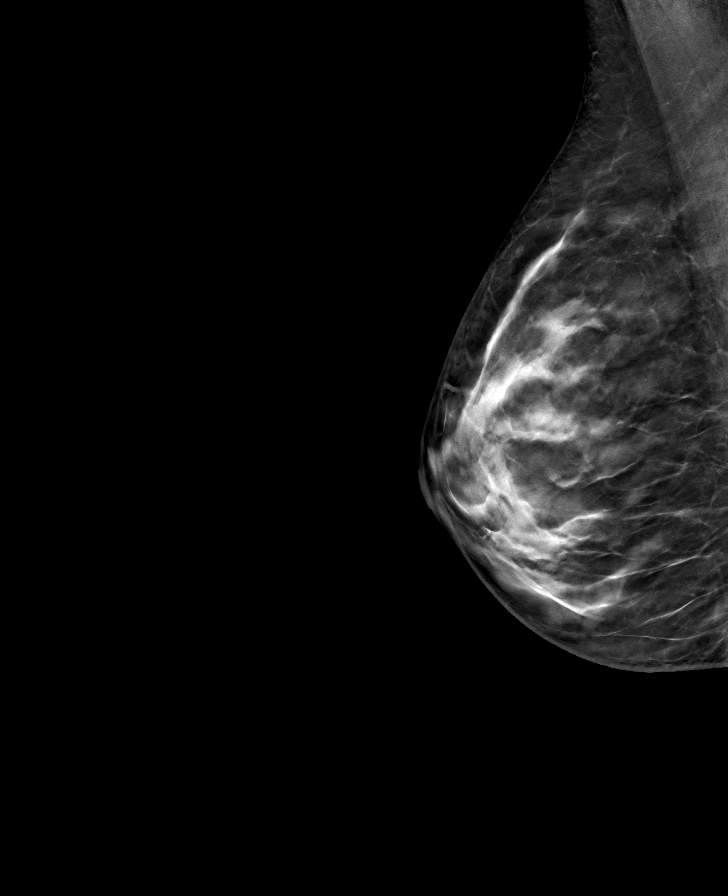

[8 of 24 positions shown; findings below may reference images not displayed]

ACR Breast Density Category c: The breast tissue is heterogeneously
dense, which may obscure small masses.
FINDINGS: There are no findings suspicious for malignancy.
IMPRESSION: No mammographic evidence of malignancy. A result letter of this
screening mammogram will be mailed directly to the patient.

RECOMMENDATION:
Screening mammogram in one year. (Code:Q3-W-BC3)

BI-RADS CATEGORY  1: Negative.

## 2022-07-04 ENCOUNTER — Other Ambulatory Visit: Payer: Self-pay | Admitting: Obstetrics & Gynecology

## 2022-07-04 NOTE — Telephone Encounter (Signed)
Medication refill request: levothyroxine  Last AEX:  06/22/21 Next AEX: nothing scheduled  Last MMG (if hormonal medication request): n/a Refill authorized: #[id number] with 0 rf pended for today

## 2022-11-30 ENCOUNTER — Other Ambulatory Visit: Payer: Self-pay | Admitting: Obstetrics & Gynecology

## 2023-03-05 ENCOUNTER — Other Ambulatory Visit: Payer: Self-pay | Admitting: Obstetrics & Gynecology

## 2023-03-05 ENCOUNTER — Ambulatory Visit (INDEPENDENT_AMBULATORY_CARE_PROVIDER_SITE_OTHER): Payer: BC Managed Care – PPO | Admitting: Obstetrics & Gynecology

## 2023-03-05 ENCOUNTER — Encounter: Payer: Self-pay | Admitting: Obstetrics & Gynecology

## 2023-03-05 VITALS — BP 116/64 | HR 58

## 2023-03-05 DIAGNOSIS — L290 Pruritus ani: Secondary | ICD-10-CM

## 2023-03-05 DIAGNOSIS — E039 Hypothyroidism, unspecified: Secondary | ICD-10-CM | POA: Diagnosis not present

## 2023-03-05 LAB — WET PREP FOR TRICH, YEAST, CLUE

## 2023-03-05 MED ORDER — CLOBETASOL PROPIONATE 0.05 % EX OINT: 1.0000 | TOPICAL_OINTMENT | Freq: Two times a day (BID) | CUTANEOUS | 1 refills | Status: DC

## 2023-03-05 MED ORDER — SYNTHROID 100 MCG PO TABS: 100.0000 ug | ORAL_TABLET | Freq: Every day | ORAL | 1 refills | Status: DC

## 2023-03-05 NOTE — Progress Notes (Signed)
    Laura Murphy Sep 06, 1970 [id number]        52 y.o.  G2P0021   RP: Rectal itching  HPI: C/O rectal itching on-off x many weeks.  H/O constipation.  No abnormal d/c. Perimenopausal with no menses x 8 months.  Using condoms.   OB History  Gravida Para Term Preterm AB Living  2       2 1   SAB IAB Ectopic Multiple Live Births  1 1     1     # Outcome Date GA Lbr Len/2nd Weight Sex Delivery Anes PTL Lv  2 IAB           1 SAB             Past medical history,surgical history, problem list, medications, allergies, family history and social history were all reviewed and documented in the EPIC chart.   Directed ROS with pertinent positives and negatives documented in the history of present illness/assessment and plan.  Exam:  Vitals:   03/05/23 1352  BP: 116/64  Pulse: (!) 58  SpO2: 99%   General appearance:  Normal  Gynecologic exam: Vulva normal.  No atrophy, no inflammation, no lesion.  Wet prep vaginally.  Perianal area:  Mild inflammation with small fissures at anterior perineal area.  Wet prep anally.  Wet prep (both specimens) Neg   Assessment/Plan:  52 y.o. G2P0021   1. Rectal itching C/O rectal itching on-off x many weeks.  H/O constipation.  No abnormal d/c. Perimenopausal with no menses x 8 months.  Using condoms. On exam: Perianal area:  Mild inflammation with small fissures at anterior perineal area. Wet prep Negative.  Inflammation.  Will treat with Clobetasol ointment.  Usage review, prescription sent to pharmacy. - WET PREP FOR TRICH, YEAST, CLUE   2. Hypothyroidism (acquired) Refills sent to local pharmacy.  Will send it to mailing pharmacy when we have the information from patient on which one she uses.  Other orders - SYNTHROID 100 MCG tablet; Take 1 tablet (100 mcg total) by mouth daily before breakfast. - clobetasol ointment (TEMOVATE) 0.05 %; Apply 1 Application topically 2 (two) times daily. Thin perianal application twice a day x 2 weeks, then  wean over 2 weeks.   Genia Del MD, 2:02 PM 03/05/2023

## 2023-03-06 ENCOUNTER — Other Ambulatory Visit: Payer: Self-pay

## 2023-03-06 DIAGNOSIS — E039 Hypothyroidism, unspecified: Secondary | ICD-10-CM

## 2023-03-06 NOTE — Telephone Encounter (Signed)
Pt LVM in triage line stating that her mail order pharmacy is the same that is in her EMR. Rx pend with correct pharmacy selected.

## 2023-03-06 NOTE — Telephone Encounter (Signed)
Medication refill request: clobetasol  Last AEX:  03/05/23 OV  Next AEX: 05/22/23 with EB  Last MMG (if hormonal medication request): n/a  Refill authorized: was filled yesterday. Pharmacy is wanting exact # of grams for application for insurance.

## 2023-03-07 ENCOUNTER — Telehealth: Payer: Self-pay

## 2023-03-07 MED ORDER — SYNTHROID 100 MCG PO TABS: 100.0000 ug | ORAL_TABLET | Freq: Every day | ORAL | 4 refills | Status: AC

## 2023-03-07 NOTE — Telephone Encounter (Signed)
Katrina from [facility] pharmacy calling to report that clobetasol rx needs to have specific gram dosage per application and location on rx for them to submit through insurance.   Ok to advise apply 1gm/thin layer to perineum/perianal area? TIA

## 2023-03-07 NOTE — Telephone Encounter (Signed)
Pt.notified

## 2023-03-07 NOTE — Telephone Encounter (Signed)
Per ML: "ok."   Pharmacy advised. Will close encounter.

## 2023-05-22 ENCOUNTER — Encounter: Payer: Self-pay | Admitting: Obstetrics and Gynecology

## 2023-05-22 ENCOUNTER — Other Ambulatory Visit (HOSPITAL_COMMUNITY)
Admission: RE | Admit: 2023-05-22 | Discharge: 2023-05-22 | Disposition: A | Discharge status: Home / Self Care | Payer: BC Managed Care – PPO | Source: Ambulatory Visit | Attending: Obstetrics and Gynecology | Admitting: Obstetrics and Gynecology

## 2023-05-22 ENCOUNTER — Ambulatory Visit (INDEPENDENT_AMBULATORY_CARE_PROVIDER_SITE_OTHER): Payer: BC Managed Care – PPO | Admitting: Obstetrics and Gynecology

## 2023-05-22 VITALS — BP 106/64 | HR 64 | Ht 66.5 in | Wt 133.0 lb

## 2023-05-22 DIAGNOSIS — L299 Pruritus, unspecified: Secondary | ICD-10-CM | POA: Diagnosis not present

## 2023-05-22 DIAGNOSIS — Z01419 Encounter for gynecological examination (general) (routine) without abnormal findings: Secondary | ICD-10-CM | POA: Diagnosis not present

## 2023-05-22 DIAGNOSIS — L900 Lichen sclerosus et atrophicus: Secondary | ICD-10-CM

## 2023-05-22 DIAGNOSIS — R1900 Intra-abdominal and pelvic swelling, mass and lump, unspecified site: Secondary | ICD-10-CM

## 2023-05-22 MED ORDER — CLOBETASOL PROP EMOLLIENT BASE 0.05 % EX CREA: 1.0000 | TOPICAL_CREAM | Freq: Two times a day (BID) | CUTANEOUS | 3 refills | Status: AC

## 2023-05-22 MED ORDER — ESTRADIOL 0.1 MG/GM VA CREA: 1.0000 | TOPICAL_CREAM | Freq: Every day | VAGINAL | 12 refills | Status: AC

## 2023-05-22 NOTE — Patient Instructions (Signed)
Apply to area with itching 3 times a day for a week then twice a day for 2 weeks then daily for 1 week then twice weekly for a week   Return to clinic for a punch biopsy and a tv ultrasound

## 2023-05-22 NOTE — Progress Notes (Signed)
52 y.o. y.o. female here for annual exam. She denies any PM bleeding.  She denies any pelvic discharge or pain. She does report increased abdominal girth and constipation at times.  She is still having bothersome pruritus near her anus and had mentioned this last year with negative wet mount. She was worried about starting clobetasol due to the risk of thinning the tissue.  Blood pressure 106/64, pulse 64, height 5' 6.5" (1.689 m), weight 133 lb (60.3 kg), SpO2 98%.  No results found for: "DIAGPAP", "HPVHIGH", "ADEQPAP"  GYN HISTORY: No results found for: "DIAGPAP", "HPVHIGH", "ADEQPAP"  OB History  Gravida Para Term Preterm AB Living  2       2 1   SAB IAB Ectopic Multiple Live Births  1 1     1     # Outcome Date GA Lbr Len/2nd Weight Sex Type Anes PTL Lv  2 IAB           1 SAB             Past Medical History:  Diagnosis Date   Osteopenia    Thyroid disease     Past Surgical History:  Procedure Laterality Date   NO PAST SURGERIES      Current Outpatient Medications on File Prior to Visit  Medication Sig Dispense Refill   calcium carbonate (OS-CAL - DOSED IN MG OF ELEMENTAL CALCIUM) 1250 (500 Ca) MG tablet Take 1 tablet by mouth.     Multiple Vitamin (MULTIVITAMIN) tablet Take 1 tablet by mouth.     SYNTHROID 100 MCG tablet Take 1 tablet (100 mcg total) by mouth daily before breakfast. 90 tablet 4   VITAMIN D PO Take 2,000 Int'l Units by mouth.     clobetasol ointment (TEMOVATE) 0.05 % APPLY THIN PERIANAL APPLICATION TWICE A DAY FOR 2 WEEKS, THEN WEAN OVER 2 WEEKS. (Patient not taking: Reported on 05/22/2023) 30 g 1   No current facility-administered medications on file prior to visit.    Social History   Socioeconomic History   Marital status: Married    Spouse name: Not on file   Number of children: Not on file   Years of education: Not on file   Highest education level: Not on file  Occupational History   Not on file  Tobacco Use   Smoking status:  Never   Smokeless tobacco: Never  Vaping Use   Vaping status: Never Used  Substance and Sexual Activity   Alcohol use: Yes    Comment: social   Drug use: Never   Sexual activity: Yes    Partners: Male    Birth control/protection: Condom    Comment: 1st intercourse- 23, partner- 1  Other Topics Concern   Not on file  Social History Narrative   Not on file   Social Determinants of Health   Financial Resource Strain: Not on file  Food Insecurity: Not on file  Transportation Needs: Not on file  Physical Activity: Not on file  Stress: Not on file  Social Connections: Not on file  Intimate Partner Violence: Not on file    Family History  Problem Relation Age of Onset   Breast cancer Mother 84   Osteoporosis Mother    Colon cancer Neg Hx    Colon polyps Neg Hx    Esophageal cancer Neg Hx    Rectal cancer Neg Hx    Stomach cancer Neg Hx      No Known Allergies    Patient's last menstrual  period was No LMP recorded. (Menstrual status: Perimenopausal)..          Sexually active: yes    Review of Systems Alls systems reviewed and are negative.     PE General appearance: alert, cooperative and appears stated age Head: Normocephalic, without obvious abnormality, atraumatic Neck: no adenopathy, supple, symmetrical, trachea midline and thyroid normal to inspection and palpation Lungs: clear to auscultation bilaterally Breasts: normal appearance, no masses or tenderness Heart: regular rate and rhythm Abdomen: soft, non-tender; bowel sounds normal; no masses,  no organomegaly Extremities: extremities normal, atraumatic, no cyanosis or edema Skin: Skin color, texture, turgor normal. No rashes or lesions Lymph nodes: Cervical, supraclavicular, and axillary nodes normal. No abnormal inguinal nodes palpated Neurologic: Grossly normal     Pelvic: External genitalia:  hypopigmentation lateral to anus c/w LS              Urethra:  normal appearing urethra with no masses,  tenderness or lesions              Bartholins and Skenes: normal                 Vagina: atrophic appearing vagina with normal color and discharge, no lesions.               Cervix: no lesions, no cervical motion tenderness               Bimanual Exam:  Uterus:  normal size, contour, position, consistency, mobility, non-tender              Adnexa: no mass, fullness, tenderness          Chaperone was present for exam.   A:         Well Woman GYN exam                             P:        Pap smear collected             Encouraged annual mammogram screening             ounseled on LS as an autoimmune process and not a STI.  Counseled on small associated risk for cancer and need for biopsy for diagnosis and to rule out an associated cancer.  She will return for this.  Counseled on the need for clobetasol and that the condition wax and wanes and can return.  To use estrogen cream nightly on the tissue and in the vagina to support the area. Bloating: to get TV US and to see GI again for further work-up if needed             Labs and immunizations with her primary- orders placed incase she is not seeing a PMD for this.  Message left for her to complete if not with a PMD.             Discussed breast self exams             Encouraged safe sexual practices and enouraged healthy lifestyle practices with diet and exercise  Earley Favor

## 2023-05-23 NOTE — Addendum Note (Signed)
Addended by: Earley Favor on: 05/23/2023 09:21 AM   Modules accepted: Orders

## 2023-05-29 DIAGNOSIS — R140 Abdominal distension (gaseous): Secondary | ICD-10-CM | POA: Diagnosis not present

## 2023-05-29 DIAGNOSIS — Z86010 Personal history of colonic polyps: Secondary | ICD-10-CM | POA: Diagnosis not present

## 2023-05-29 DIAGNOSIS — K5900 Constipation, unspecified: Secondary | ICD-10-CM | POA: Diagnosis not present

## 2023-05-29 DIAGNOSIS — K602 Anal fissure, unspecified: Secondary | ICD-10-CM | POA: Diagnosis not present

## 2023-05-29 LAB — CYTOLOGY - PAP: Diagnosis: NEGATIVE

## 2023-06-25 ENCOUNTER — Telehealth: Payer: Self-pay | Admitting: *Deleted

## 2023-06-25 NOTE — Telephone Encounter (Signed)
Patient left message requesting return call to further discuss LS Dx from 05/22/23 [facility].   Left message to call [facility] Triage at [phone], option 4.

## 2023-07-10 NOTE — Telephone Encounter (Signed)
Spoke w/ pt and she voiced extreme concern that she was told and provided with a diagnosis from her last visit with Dr. Karma Greaser for LS. States that she has spent hours and hours researching this condition and feels that she is 99.9% sure that she doesn't have it.  States the only sxs she was experiencing was some slight rectal itching and she reported that it turned out to be due to her "over cleansing." Was seen by her colorectal provider and was provided with a medication for it and is now doing well.   States that she would like to be seen again and re-evaluated by Dr. Karma Greaser and have this completely ruled out because she does not want this condition in her record.   Please advise.

## 2023-07-23 NOTE — Telephone Encounter (Signed)
 Left message to call Noreene Larsson, RN at [facility], [phone], option 4.

## 2023-09-04 NOTE — Telephone Encounter (Signed)
Called x2, no return call from patient.   No future appointment scheduled.   Routing to Dr. Michela Pitcher.   Encounter closed.

## 2024-02-05 DIAGNOSIS — Z7689 Persons encountering health services in other specified circumstances: Secondary | ICD-10-CM | POA: Diagnosis not present

## 2024-02-05 DIAGNOSIS — Z1239 Encounter for other screening for malignant neoplasm of breast: Secondary | ICD-10-CM | POA: Diagnosis not present

## 2024-02-05 DIAGNOSIS — Z0000 Encounter for general adult medical examination without abnormal findings: Secondary | ICD-10-CM | POA: Diagnosis not present

## 2024-02-05 DIAGNOSIS — M8580 Other specified disorders of bone density and structure, unspecified site: Secondary | ICD-10-CM | POA: Diagnosis not present

## 2024-02-05 DIAGNOSIS — M25551 Pain in right hip: Secondary | ICD-10-CM | POA: Diagnosis not present

## 2024-02-05 DIAGNOSIS — E039 Hypothyroidism, unspecified: Secondary | ICD-10-CM | POA: Diagnosis not present

## 2024-02-22 DIAGNOSIS — Z13220 Encounter for screening for lipoid disorders: Secondary | ICD-10-CM | POA: Diagnosis not present

## 2024-02-22 DIAGNOSIS — M8580 Other specified disorders of bone density and structure, unspecified site: Secondary | ICD-10-CM | POA: Diagnosis not present

## 2024-02-22 DIAGNOSIS — Z0000 Encounter for general adult medical examination without abnormal findings: Secondary | ICD-10-CM | POA: Diagnosis not present

## 2024-02-22 DIAGNOSIS — E039 Hypothyroidism, unspecified: Secondary | ICD-10-CM | POA: Diagnosis not present

## 2024-02-25 ENCOUNTER — Other Ambulatory Visit: Payer: Self-pay | Admitting: Family Medicine

## 2024-02-25 DIAGNOSIS — Z1231 Encounter for screening mammogram for malignant neoplasm of breast: Secondary | ICD-10-CM

## 2024-04-14 DIAGNOSIS — E039 Hypothyroidism, unspecified: Secondary | ICD-10-CM | POA: Diagnosis not present

## 2024-04-22 ENCOUNTER — Ambulatory Visit

## 2024-04-23 ENCOUNTER — Ambulatory Visit
Admission: RE | Admit: 2024-04-23 | Discharge: 2024-04-23 | Disposition: A | Discharge status: Home / Self Care | Source: Ambulatory Visit | Attending: Family Medicine | Admitting: Family Medicine

## 2024-04-23 DIAGNOSIS — Z1231 Encounter for screening mammogram for malignant neoplasm of breast: Secondary | ICD-10-CM

## 2024-08-05 DIAGNOSIS — Z780 Asymptomatic menopausal state: Secondary | ICD-10-CM | POA: Diagnosis not present

## 2024-08-05 DIAGNOSIS — M8589 Other specified disorders of bone density and structure, multiple sites: Secondary | ICD-10-CM | POA: Diagnosis not present
